# Patient Record
Sex: Female | Born: 1985 | Race: Black or African American | Hispanic: No | Marital: Single | Smoking: Never smoker
Health system: Southern US, Community
[De-identification: ages and names within clinical notes are randomized; demographics above are authoritative.]

## PROBLEM LIST (undated history)

## (undated) HISTORY — PX: TMJ ARTHROPLASTY: SHX1066

---

## 2010-07-24 ENCOUNTER — Emergency Department (HOSPITAL_COMMUNITY): Admission: EM | Admit: 2010-07-24 | Discharge: 2010-07-24 | Payer: Self-pay | Admitting: Emergency Medicine

## 2010-12-15 LAB — POCT PREGNANCY, URINE: Preg Test, Ur: NEGATIVE

## 2010-12-15 LAB — URINALYSIS, ROUTINE W REFLEX MICROSCOPIC
Bilirubin Urine: NEGATIVE
Glucose, UA: NEGATIVE mg/dL
Ketones, ur: NEGATIVE mg/dL
Protein, ur: 100 mg/dL — AB
pH: 6 (ref 5.0–8.0)

## 2010-12-15 LAB — URINE MICROSCOPIC-ADD ON

## 2011-11-14 ENCOUNTER — Other Ambulatory Visit: Payer: Self-pay | Admitting: Obstetrics and Gynecology

## 2011-11-14 DIAGNOSIS — N63 Unspecified lump in unspecified breast: Secondary | ICD-10-CM

## 2011-11-16 ENCOUNTER — Other Ambulatory Visit: Payer: Self-pay | Admitting: Obstetrics and Gynecology

## 2011-11-16 ENCOUNTER — Ambulatory Visit
Admission: RE | Admit: 2011-11-16 | Discharge: 2011-11-16 | Disposition: A | Discharge status: Home / Self Care | Payer: BC Managed Care – PPO | Source: Ambulatory Visit | Attending: Obstetrics and Gynecology | Admitting: Obstetrics and Gynecology

## 2011-11-16 ENCOUNTER — Other Ambulatory Visit: Payer: Self-pay

## 2011-11-16 DIAGNOSIS — N63 Unspecified lump in unspecified breast: Secondary | ICD-10-CM

## 2012-04-23 ENCOUNTER — Other Ambulatory Visit: Payer: Self-pay | Admitting: Obstetrics and Gynecology

## 2012-04-23 DIAGNOSIS — D249 Benign neoplasm of unspecified breast: Secondary | ICD-10-CM

## 2012-05-09 ENCOUNTER — Ambulatory Visit
Admission: RE | Admit: 2012-05-09 | Discharge: 2012-05-09 | Disposition: A | Discharge status: Home / Self Care | Payer: BC Managed Care – PPO | Source: Ambulatory Visit | Attending: Obstetrics and Gynecology | Admitting: Obstetrics and Gynecology

## 2012-05-09 DIAGNOSIS — D249 Benign neoplasm of unspecified breast: Secondary | ICD-10-CM

## 2012-10-22 ENCOUNTER — Other Ambulatory Visit: Payer: Self-pay | Admitting: Obstetrics and Gynecology

## 2012-10-22 DIAGNOSIS — N63 Unspecified lump in unspecified breast: Secondary | ICD-10-CM

## 2012-11-12 ENCOUNTER — Ambulatory Visit
Admission: RE | Admit: 2012-11-12 | Discharge: 2012-11-12 | Disposition: A | Discharge status: Home / Self Care | Payer: BC Managed Care – PPO | Source: Ambulatory Visit | Attending: Obstetrics and Gynecology | Admitting: Obstetrics and Gynecology

## 2012-11-12 DIAGNOSIS — N63 Unspecified lump in unspecified breast: Secondary | ICD-10-CM

## 2013-05-13 ENCOUNTER — Other Ambulatory Visit: Payer: Self-pay | Admitting: Obstetrics and Gynecology

## 2013-05-13 DIAGNOSIS — N6320 Unspecified lump in the left breast, unspecified quadrant: Secondary | ICD-10-CM

## 2013-05-20 ENCOUNTER — Ambulatory Visit
Admission: RE | Admit: 2013-05-20 | Discharge: 2013-05-20 | Disposition: A | Discharge status: Home / Self Care | Payer: Self-pay | Source: Ambulatory Visit | Attending: Obstetrics and Gynecology | Admitting: Obstetrics and Gynecology

## 2013-05-20 DIAGNOSIS — N6320 Unspecified lump in the left breast, unspecified quadrant: Secondary | ICD-10-CM

## 2013-11-25 ENCOUNTER — Other Ambulatory Visit: Payer: Self-pay | Admitting: Obstetrics and Gynecology

## 2013-11-25 DIAGNOSIS — D249 Benign neoplasm of unspecified breast: Secondary | ICD-10-CM

## 2013-11-29 ENCOUNTER — Ambulatory Visit
Admission: RE | Admit: 2013-11-29 | Discharge: 2013-11-29 | Disposition: A | Discharge status: Home / Self Care | Payer: BC Managed Care – PPO | Source: Ambulatory Visit | Attending: Obstetrics and Gynecology | Admitting: Obstetrics and Gynecology

## 2013-11-29 DIAGNOSIS — D249 Benign neoplasm of unspecified breast: Secondary | ICD-10-CM

## 2014-04-17 LAB — OB RESULTS CONSOLE ANTIBODY SCREEN: Antibody Screen: NEGATIVE

## 2014-04-17 LAB — OB RESULTS CONSOLE HIV ANTIBODY (ROUTINE TESTING): HIV: NONREACTIVE

## 2014-04-17 LAB — OB RESULTS CONSOLE RPR: RPR: NONREACTIVE

## 2014-04-17 LAB — OB RESULTS CONSOLE ABO/RH: RH Type: POSITIVE

## 2014-04-17 LAB — OB RESULTS CONSOLE GC/CHLAMYDIA
Chlamydia: NEGATIVE
Gonorrhea: NEGATIVE

## 2014-04-17 LAB — OB RESULTS CONSOLE HEPATITIS B SURFACE ANTIGEN: HEP B S AG: NEGATIVE

## 2014-04-17 LAB — OB RESULTS CONSOLE RUBELLA ANTIBODY, IGM: RUBELLA: IMMUNE

## 2014-09-30 LAB — OB RESULTS CONSOLE GBS: GBS: POSITIVE

## 2014-10-03 NOTE — L&D Delivery Note (Signed)
Delivery Note At 12:11 PM a viable and healthy female was delivered via Vaginal, Spontaneous Delivery (Presentation:OA ; ROT  ).  APGAR: 8,9 ; weight P .   Placenta status: Intact, Spontaneous.  Cord: 3 vessels with the following complications: None.    Anesthesia: Epidural  Episiotomy: None Lacerations: 1st degree Suture Repair: 3.0 vicryl rapide Est. Blood Loss (mL): 400  Mom to postpartum.  Baby to Couplet care / Skin to Skin.  Green, Annette Merriott 10/23/2014, 12:32 PM  Br/Bo, RI, Tdap in [facility], O+, Contra?  D/W pt r/b/a of circumcision for female infant, wish to proceed

## 2014-10-22 ENCOUNTER — Inpatient Hospital Stay (HOSPITAL_COMMUNITY)
Admission: AD | Admit: 2014-10-22 | Discharge: 2014-10-25 | DRG: 775 | Disposition: A | Discharge status: Home / Self Care | Payer: BLUE CROSS/BLUE SHIELD | Source: Ambulatory Visit | Attending: Obstetrics and Gynecology | Admitting: Obstetrics and Gynecology

## 2014-10-22 ENCOUNTER — Encounter (HOSPITAL_COMMUNITY): Payer: Self-pay | Admitting: *Deleted

## 2014-10-22 ENCOUNTER — Inpatient Hospital Stay (HOSPITAL_COMMUNITY): Payer: BLUE CROSS/BLUE SHIELD

## 2014-10-22 DIAGNOSIS — O471 False labor at or after 37 completed weeks of gestation: Secondary | ICD-10-CM

## 2014-10-22 DIAGNOSIS — I959 Hypotension, unspecified: Secondary | ICD-10-CM | POA: Diagnosis present

## 2014-10-22 DIAGNOSIS — O4690 Antepartum hemorrhage, unspecified, unspecified trimester: Secondary | ICD-10-CM | POA: Diagnosis present

## 2014-10-22 DIAGNOSIS — O289 Unspecified abnormal findings on antenatal screening of mother: Secondary | ICD-10-CM | POA: Diagnosis not present

## 2014-10-22 DIAGNOSIS — O288 Other abnormal findings on antenatal screening of mother: Secondary | ICD-10-CM | POA: Insufficient documentation

## 2014-10-22 DIAGNOSIS — Z3A38 38 weeks gestation of pregnancy: Secondary | ICD-10-CM | POA: Diagnosis not present

## 2014-10-22 LAB — CBC
HCT: 33.8 % — ABNORMAL LOW (ref 36.0–46.0)
HEMOGLOBIN: 11.6 g/dL — AB (ref 12.0–15.0)
MCH: 27.6 pg (ref 26.0–34.0)
MCHC: 34.3 g/dL (ref 30.0–36.0)
MCV: 80.3 fL (ref 78.0–100.0)
PLATELETS: 257 10*3/uL (ref 150–400)
RBC: 4.21 MIL/uL (ref 3.87–5.11)
RDW: 14.1 % (ref 11.5–15.5)
WBC: 18 10*3/uL — AB (ref 4.0–10.5)

## 2014-10-22 MED ORDER — LACTATED RINGERS IV SOLN
INTRAVENOUS | Status: DC
  Administered 2014-10-22 – 2014-10-23 (×3): via INTRAVENOUS

## 2014-10-22 NOTE — MAU Note (Signed)
Pt states she noted blood in her underwear today @ approx 1630, also mucus.  Has had some abd cramping since then.

## 2014-10-22 NOTE — MAU Provider Note (Signed)
History     CSN: [id number]  Arrival date and time: 10/22/14 3474   First Provider Initiated Contact with Patient 10/22/14 1936      Chief Complaint  Patient presents with  . Abdominal Cramping  . Vaginal Bleeding   HPI    Ms.Annette Green is a 29 y.o. female G1P0 at [redacted]w[redacted]d who presents with concerns regarding a gush of fluid around 1700; the fluid was pink in color. After she used the bathroom she noticed blood clots in the toilet. She complains of mild abdominal cramping.   + fetal movement Denies bleeding since she has been in MAU.   OB History    Gravida Para Term Preterm AB TAB SAB Ectopic Multiple Living   1               History reviewed. No pertinent past medical history.  History reviewed. No pertinent past surgical history.  History reviewed. No pertinent family history.  History  Substance Use Topics  . Smoking status: Never Smoker   . Smokeless tobacco: Not on file  . Alcohol Use: Not on file    Allergies: No Known Allergies  Prescriptions prior to admission  Medication Sig Dispense Refill Last Dose  . Dimethicone-Zinc Oxide-Vit A-D (A & D ZINC OXIDE EX) Apply 1 application topically at bedtime.   10/21/2014 at Unknown time  . ferrous sulfate 325 (65 FE) MG tablet Take 325 mg by mouth 2 (two) times daily with a meal.   10/22/2014 at Unknown time  . Prenatal Vit-Fe Fumarate-FA (PRENATAL MULTIVITAMIN) TABS tablet Take 1 tablet by mouth at bedtime.   10/21/2014 at Unknown time   No results found for this or any previous visit (from the past 48 hour(s)).   Review of Systems  Constitutional: Negative for fever and chills.  Gastrointestinal: Positive for abdominal pain (Mild menstrual cramping ). Negative for nausea, vomiting, diarrhea and constipation.  Genitourinary: Negative for dysuria, urgency and frequency.   Physical Exam   Blood pressure 119/80, pulse 106, temperature 99 F (37.2 C), resp. rate 18, height 5\' 5"  (1.651 m), weight 86.637 kg (191  lb).  Physical Exam  Constitutional: She is oriented to person, place, and time. She appears well-developed and well-nourished. No distress.  HENT:  Head: Normocephalic.  GI: Soft.  Genitourinary:   Speculum exam: Vagina - Small-moderate amount of mucus like, pink discharge with few, pea size clots. No pooling of fluid in the vaginal canal.  Cervix - No contact bleeding Chaperone present for exam. Dilation: 1.5 Effacement (%): 70. Bag of water palpated through cervix.  Exam by:: [name] NP  Musculoskeletal: Normal range of motion.  Neurological: She is alert and oriented to person, place, and time.  Skin: Skin is warm. She is not diaphoretic.  Psychiatric: Her behavior is normal.    Fetal Tracing: Baseline: 140 bpm  Variability:Moderate Accelerations: 15x15 Decelerations: variable decel down to 115 lasting 35 secs with recovery back to baseline  Toco: UI, irregular contractions  Decelerations: multiple variable decelerations and few late decelerations noted Contractions: irregular  NST reviewed remotely by Dr. Ulanda Edison  MAU Course  Procedures  None  MDM [name] slide negative  Spoke to Dr. Ulanda Edison at 2000 BPP Will monitor patient in MAU  Report given to [name] PA who resumes care of the patient.   [name], NP 10/22/2014 8:05 PM  2005 - Care assumed from [name], NP. Patient in [place] for BPP.  BPP 6/8 - no sustained breathing noted 2140 -  Called to discuss with Dr. Ulanda Edison. He will review the NST from home and let me know what he decides for patient management 2200 - received call from Dr. Ulanda Edison. He has reviewed the NST. He would like to continue to monitor x 1 hour and re-assess at that time whether patient can be discharged or requires admission.  2315 - Called Dr. Ulanda Edison. He will review NST again and call back to MAU with plan for patient Dr Ulanda Edison returns call to MAU. He would like to admit patient to L&D for induction of labor due to variable  and late decelerations noted on NST.  Discussed this with the patient and her mother. They agree with the plan.  Orders placed for induction per Dr. Ulanda Edison and routine labor admit orders Assessment and Plan  A: SIUP at [redacted]w[redacted]d Uterine contractions Vaginal bleeding in pregnancy Decelerations on NST  P: Admit to L&D for induction of labor  Luvenia Redden, PA-C  10/22/2014 9:08 PM

## 2014-10-22 NOTE — MAU Note (Signed)
Pt unsure about why an induction is necessary, Tomi Bamberger PA in to discuss with patient about reasons and orders that Dr. Ulanda Edison has left.

## 2014-10-22 NOTE — MAU Note (Signed)
Dr. Ulanda Edison spoke with Tomi Bamberger PA and would like patient to be monitored for another 1 hour before making a decision to send patient home or admit patient. Patient to be given this information.

## 2014-10-23 ENCOUNTER — Encounter (HOSPITAL_COMMUNITY): Payer: Self-pay | Admitting: *Deleted

## 2014-10-23 ENCOUNTER — Inpatient Hospital Stay (HOSPITAL_COMMUNITY): Payer: BLUE CROSS/BLUE SHIELD | Admitting: Anesthesiology

## 2014-10-23 DIAGNOSIS — O4690 Antepartum hemorrhage, unspecified, unspecified trimester: Secondary | ICD-10-CM | POA: Diagnosis present

## 2014-10-23 DIAGNOSIS — Z3A38 38 weeks gestation of pregnancy: Secondary | ICD-10-CM | POA: Diagnosis present

## 2014-10-23 DIAGNOSIS — O289 Unspecified abnormal findings on antenatal screening of mother: Secondary | ICD-10-CM | POA: Insufficient documentation

## 2014-10-23 DIAGNOSIS — O288 Other abnormal findings on antenatal screening of mother: Secondary | ICD-10-CM | POA: Insufficient documentation

## 2014-10-23 DIAGNOSIS — I959 Hypotension, unspecified: Secondary | ICD-10-CM | POA: Diagnosis present

## 2014-10-23 LAB — ABO/RH: ABO/RH(D): O POS

## 2014-10-23 LAB — TYPE AND SCREEN
ABO/RH(D): O POS
ANTIBODY SCREEN: NEGATIVE

## 2014-10-23 MED ORDER — DIPHENHYDRAMINE HCL 25 MG PO CAPS: 25.0000 mg | ORAL_CAPSULE | Freq: Four times a day (QID) | ORAL | Status: DC | PRN

## 2014-10-23 MED ORDER — OXYTOCIN BOLUS FROM INFUSION: 500.0000 mL | INTRAVENOUS | Status: DC

## 2014-10-23 MED ORDER — ONDANSETRON HCL 4 MG PO TABS: 4.0000 mg | ORAL_TABLET | ORAL | Status: DC | PRN

## 2014-10-23 MED ORDER — OXYCODONE-ACETAMINOPHEN 5-325 MG PO TABS: 2.0000 | ORAL_TABLET | ORAL | Status: DC | PRN

## 2014-10-23 MED ORDER — LIDOCAINE HCL (PF) 1 % IJ SOLN
30.0000 mL | INTRAMUSCULAR | Status: DC | PRN
  Filled 2014-10-23: qty 30

## 2014-10-23 MED ORDER — OXYCODONE-ACETAMINOPHEN 5-325 MG PO TABS: 1.0000 | ORAL_TABLET | ORAL | Status: DC | PRN

## 2014-10-23 MED ORDER — PHENYLEPHRINE 40 MCG/ML (10ML) SYRINGE FOR IV PUSH (FOR BLOOD PRESSURE SUPPORT)
80.0000 ug | PREFILLED_SYRINGE | INTRAVENOUS | Status: DC | PRN
  Administered 2014-10-23: 80 ug via INTRAVENOUS
  Filled 2014-10-23: qty 2

## 2014-10-23 MED ORDER — LANOLIN HYDROUS EX OINT: TOPICAL_OINTMENT | CUTANEOUS | Status: DC | PRN

## 2014-10-23 MED ORDER — EPHEDRINE 5 MG/ML INJ
10.0000 mg | INTRAVENOUS | Status: DC | PRN
  Filled 2014-10-23: qty 2
  Filled 2014-10-23: qty 4

## 2014-10-23 MED ORDER — PENICILLIN G POTASSIUM 5000000 UNITS IJ SOLR
2.5000 10*6.[IU] | INTRAVENOUS | Status: DC
  Administered 2014-10-23 (×2): 2.5 10*6.[IU] via INTRAVENOUS
  Filled 2014-10-23 (×7): qty 2.5

## 2014-10-23 MED ORDER — WITCH HAZEL-GLYCERIN EX PADS: 1.0000 "application " | MEDICATED_PAD | CUTANEOUS | Status: DC | PRN

## 2014-10-23 MED ORDER — ONDANSETRON HCL 4 MG/2ML IJ SOLN: 4.0000 mg | INTRAMUSCULAR | Status: DC | PRN

## 2014-10-23 MED ORDER — PRENATAL MULTIVITAMIN CH
1.0000 | ORAL_TABLET | Freq: Every day | ORAL | Status: DC
  Administered 2014-10-24 – 2014-10-25 (×2): 1 via ORAL
  Filled 2014-10-23 (×2): qty 1

## 2014-10-23 MED ORDER — BENZOCAINE-MENTHOL 20-0.5 % EX AERO
1.0000 "application " | INHALATION_SPRAY | CUTANEOUS | Status: DC | PRN
  Administered 2014-10-23: 1 via TOPICAL
  Filled 2014-10-23: qty 56

## 2014-10-23 MED ORDER — IBUPROFEN 600 MG PO TABS
600.0000 mg | ORAL_TABLET | Freq: Four times a day (QID) | ORAL | Status: DC
  Administered 2014-10-24 – 2014-10-25 (×7): 600 mg via ORAL
  Filled 2014-10-23 (×8): qty 1

## 2014-10-23 MED ORDER — DIPHENHYDRAMINE HCL 50 MG/ML IJ SOLN: 12.5000 mg | INTRAMUSCULAR | Status: DC | PRN

## 2014-10-23 MED ORDER — OXYTOCIN 40 UNITS IN LACTATED RINGERS INFUSION - SIMPLE MED
62.5000 mL/h | INTRAVENOUS | Status: DC
  Administered 2014-10-23: 62.5 mL/h via INTRAVENOUS
  Filled 2014-10-23: qty 1000

## 2014-10-23 MED ORDER — LIDOCAINE-EPINEPHRINE (PF) 2 %-1:200000 IJ SOLN
INTRAMUSCULAR | Status: DC | PRN
  Administered 2014-10-23: 3 mL

## 2014-10-23 MED ORDER — FLEET ENEMA 7-19 GM/118ML RE ENEM: 1.0000 | ENEMA | RECTAL | Status: DC | PRN

## 2014-10-23 MED ORDER — PHENYLEPHRINE 40 MCG/ML (10ML) SYRINGE FOR IV PUSH (FOR BLOOD PRESSURE SUPPORT)
80.0000 ug | PREFILLED_SYRINGE | INTRAVENOUS | Status: AC | PRN
  Administered 2014-10-23 (×3): 80 ug via INTRAVENOUS
  Filled 2014-10-23: qty 20

## 2014-10-23 MED ORDER — LACTATED RINGERS IV SOLN
500.0000 mL | Freq: Once | INTRAVENOUS | Status: AC
  Administered 2014-10-23: 500 mL via INTRAVENOUS

## 2014-10-23 MED ORDER — LACTATED RINGERS IV SOLN
500.0000 mL | INTRAVENOUS | Status: DC | PRN
Start: 2014-10-23 — End: 2014-10-23

## 2014-10-23 MED ORDER — SENNOSIDES-DOCUSATE SODIUM 8.6-50 MG PO TABS
2.0000 | ORAL_TABLET | ORAL | Status: DC
  Administered 2014-10-24 – 2014-10-25 (×2): 2 via ORAL
  Filled 2014-10-23 (×2): qty 2

## 2014-10-23 MED ORDER — ZOLPIDEM TARTRATE 5 MG PO TABS
5.0000 mg | ORAL_TABLET | Freq: Every evening | ORAL | Status: DC | PRN
Start: 2014-10-23 — End: 2014-10-25

## 2014-10-23 MED ORDER — LACTATED RINGERS IV SOLN: INTRAVENOUS | Status: DC

## 2014-10-23 MED ORDER — EPHEDRINE 5 MG/ML INJ
10.0000 mg | INTRAVENOUS | Status: DC | PRN
  Administered 2014-10-23: 10 mg via INTRAVENOUS
  Filled 2014-10-23: qty 2

## 2014-10-23 MED ORDER — ACETAMINOPHEN 325 MG PO TABS: 650.0000 mg | ORAL_TABLET | ORAL | Status: DC | PRN

## 2014-10-23 MED ORDER — OXYTOCIN 40 UNITS IN LACTATED RINGERS INFUSION - SIMPLE MED
1.0000 m[IU]/min | INTRAVENOUS | Status: DC
  Administered 2014-10-23 (×2): 1 m[IU]/min via INTRAVENOUS
  Administered 2014-10-23: 2 m[IU]/min via INTRAVENOUS

## 2014-10-23 MED ORDER — DIBUCAINE 1 % RE OINT: 1.0000 "application " | TOPICAL_OINTMENT | RECTAL | Status: DC | PRN

## 2014-10-23 MED ORDER — FENTANYL 2.5 MCG/ML BUPIVACAINE 1/10 % EPIDURAL INFUSION (WH - ANES)
14.0000 mL/h | INTRAMUSCULAR | Status: DC | PRN
  Administered 2014-10-23: 14 mL/h via EPIDURAL
  Filled 2014-10-23: qty 125

## 2014-10-23 MED ORDER — CITRIC ACID-SODIUM CITRATE 334-500 MG/5ML PO SOLN: 30.0000 mL | ORAL | Status: DC | PRN

## 2014-10-23 MED ORDER — OXYCODONE-ACETAMINOPHEN 5-325 MG PO TABS
1.0000 | ORAL_TABLET | ORAL | Status: DC | PRN
  Administered 2014-10-24 – 2014-10-25 (×5): 1 via ORAL
  Filled 2014-10-23 (×5): qty 1

## 2014-10-23 MED ORDER — PENICILLIN G POTASSIUM 5000000 UNITS IJ SOLR
5.0000 10*6.[IU] | Freq: Once | INTRAVENOUS | Status: AC
  Administered 2014-10-23: 5 10*6.[IU] via INTRAVENOUS
  Filled 2014-10-23: qty 5

## 2014-10-23 MED ORDER — SIMETHICONE 80 MG PO CHEW: 80.0000 mg | CHEWABLE_TABLET | ORAL | Status: DC | PRN

## 2014-10-23 MED ORDER — BUPIVACAINE HCL (PF) 0.25 % IJ SOLN
INTRAMUSCULAR | Status: DC | PRN
  Administered 2014-10-23 (×2): 4 mL via EPIDURAL

## 2014-10-23 MED ORDER — ONDANSETRON HCL 4 MG/2ML IJ SOLN
4.0000 mg | Freq: Four times a day (QID) | INTRAMUSCULAR | Status: DC | PRN
  Administered 2014-10-23: 4 mg via INTRAVENOUS
  Filled 2014-10-23: qty 2

## 2014-10-23 MED ORDER — TERBUTALINE SULFATE 1 MG/ML IJ SOLN
0.2500 mg | Freq: Once | INTRAMUSCULAR | Status: DC | PRN
Start: 2014-10-23 — End: 2014-10-23
  Filled 2014-10-23: qty 1

## 2014-10-23 MED ORDER — FENTANYL 2.5 MCG/ML BUPIVACAINE 1/10 % EPIDURAL INFUSION (WH - ANES)
INTRAMUSCULAR | Status: DC | PRN
  Administered 2014-10-23: 14 mL/h via EPIDURAL

## 2014-10-23 NOTE — Progress Notes (Signed)
Patient ID: Annette Green, female   DOB: [birth year], 29 y.o.   MRN: [id number]  Comfortable with epidural.    AF VSS gen NAD FHTs 150's, some variables, + scalp stim category 1-2 toco q 2-4  SVE 7.8/90/0-+1  IUPC placed w/o diff/comp  IOL for non-reassuring strip Close monitoring Increase pitocin prn, now 68mU

## 2014-10-23 NOTE — Progress Notes (Signed)
Patient ID: Annette Green, female   DOB: 10/25/1985, 29 y.o.   MRN: [id number] Pitocin is at 1 mu/minute and the contractions are q 2-4 minutes. There are some decelerations but nothing that requires delivery. The cervix is 7 cm slightly swollen and the vertex is at - 1/-2 station. I am transferring her care to Dr. Melba Coon

## 2014-10-23 NOTE — Anesthesia Preprocedure Evaluation (Signed)
Anesthesia Evaluation  Patient identified by MRN, date of birth, ID band  Reviewed: Allergy & Precautions, NPO status , Patient's Chart, lab work & pertinent test results  History of Anesthesia Complications Negative for: history of anesthetic complications  Airway Mallampati: II  TM Distance: >3 FB Neck ROM: Full    Dental  (+) Teeth Intact   Pulmonary neg pulmonary ROS,  breath sounds clear to auscultation        Cardiovascular negative cardio ROS  Rhythm:Regular     Neuro/Psych negative neurological ROS  negative psych ROS   GI/Hepatic negative GI ROS, Neg liver ROS,   Endo/Other    Renal/GU negative Renal ROS     Musculoskeletal   Abdominal   Peds  Hematology  (+) anemia ,   Anesthesia Other Findings   Reproductive/Obstetrics (+) Pregnancy                             Anesthesia Physical Anesthesia Plan  ASA: II  Anesthesia Plan:    Post-op Pain Management:    Induction:   Airway Management Planned:   Additional Equipment:   Intra-op Plan:   Post-operative Plan:   Informed Consent: I have reviewed the patients History and Physical, chart, labs and discussed the procedure including the risks, benefits and alternatives for the proposed anesthesia with the patient or authorized representative who has indicated his/her understanding and acceptance.     Plan Discussed with: Anesthesiologist  Anesthesia Plan Comments:         Anesthesia Quick Evaluation

## 2014-10-23 NOTE — Progress Notes (Signed)
Patient ID: Annette Green, female   DOB: 12/11/1985, 29 y.o.   MRN: [id number] Pt received an epidural and quickly began having decelerations of the FHR and hypotension Position was changed from left to right to left and O2 was given Dr. Ermalene Postin was called after 3 doses of phenylephrine were ineffective at raising her BP and decelerations and bradycardia persisted. He gave her ephedrine.The BP remained low but stopping pitocin has coincided with improvement of the FHR.Will see if progress occurs without pitocin.

## 2014-10-23 NOTE — Progress Notes (Signed)
Dr Ulanda Edison at bedside during epidural placement. Monitors reapplied at 0427. BP lower than baseline after medication started by Dr Ermalene Postin. FHR baseline 135 with late, prolonged decelerations down to max of 80's. Position changed to left tilt, phenylephrine given at 0438, 0442, 0446. Dr Ermalene Postin called to bedside and an additional dose was given by MD at 0447. Ephedrine given at 0449 By Dr Ermalene Postin. BP now 102/49. Pitocin d/c'ed at 0442. O2 10/L FM @ 0442. At 0452 baseline now 150's with late decelerations but no further prolonged decels. Dr Ulanda Edison remained at bedside and reviewed strip. Order to restart pitocin at 1 mu/min at [facility] and to continue to increase by 1 mu/min Q 30 minutes as long as FHR remains stable. MD Aware of late decelerations.

## 2014-10-23 NOTE — H&P (Signed)
NAMEMarland Kitchen  JUDAEA, BURGOON NO.:  [id number]  MEDICAL RECORD NO.:  [id number]  LOCATION:                                 FACILITY:  PHYSICIAN:  Lucille Passy. Ulanda Edison, M.D.      DATE OF BIRTH:  DATE OF ADMISSION:  10/23/2014 DATE OF DISCHARGE:                             HISTORY & PHYSICAL   PRESENT ILLNESS:  This is a 29 year old black female, para 0-0-1-0, gravida 2, EDC November 04, 2014, admitted for induction of labor because of a nonreassuring fetal heart rate pattern with repetitive, but not consistent, late decelerations of the fetal heart rate.  Ultrasound on April 01, 2014, showed an intrauterine pregnancy at 8 weeks and 6 days, [facility] November 05, 2014, final due date was given as November 04, 2014, based on her last menstrual period of January 28, 2014.  The ultrasound at that time showed an anterior subserosal fibroid.  Blood group and type O positive, negative antibody, RPR negative, urine culture negative, hepatitis B surface antigen negative, HIV negative, GC and Chlamydia negative.  Rubella immune.  Hemoglobin electrophoresis AA.  Cystic fibrosis negative.  First trimester screen and AFP normal.  One-hour Glucola 86.  Group B strep positive.  Repeat HIV and RPR negative.  The patient began her prenatal course after her early ultrasound.  At her 81- week labs, she was anemic and advised to take ferrous sulfate.  She had no significant problems during the pregnancy.  Her initial weight was 162, her final weight was 191.  Blood pressure remained normal.  At her last prenatal visit, her cervix was 1 cm, 50% effaced, and that was on October 20, 2014.  She came to the Maternity Admission Unit on October 22, 2014, after noting some bloody discharge from the vagina.  She had, over the course of 3 hours or so, recurrent late decelerations of the fetal heart rate even though they were not very close together.  A biophysical profile of 6/8.  When the decelerations continued, I  elected to induce her labor rather than send her home.  She was admitted, placed on Pitocin, and thus far has tolerated labor.  PAST MEDICAL HISTORY:  Reveals that she has a history of eczema.  PAST SURGICAL HISTORY:  None.  ALLERGIES:  No known drug allergies.  No latex allergy or food allergy.  GYN HISTORY:  She has had evaluation for abnormal Pap smears since 2009, with low-grade SIL.  She had a history of chlamydia x2 in 2009.  FAMILY HISTORY:  Mother with hypertension.  Maternal grandmother with chronic renal failure and died at age 69.  SOCIAL HISTORY:  The patient has never smoked.  Occasionally drank alcohol prior to pregnancy.  Denies illicit drugs.  Last marijuana was Feb 14, 2014.  She apparently has a 4 years of college at [employer] in Therapist, occupational.  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  PHYSICAL EXAMINATION:  VITAL SIGNS:  On admission, temperature 98.6, pulse 100, respirations 20, blood pressure 109/72. HEART:  Normal size and sounds.  No murmurs. LUNGS:  Clear to auscultation. ABDOMEN:  At her last prenatal visit, her fundal height was 38 cm.  At  present time, the fetal heart tones are normal.  Cervix is 4 cm, 100% vertex, at a -2.  Artificial rupture of the membranes produced clear fluid.  The patient had requested and is prepared to receive an epidural.  ADMITTING IMPRESSION:  Intrauterine pregnancy at 38 weeks and 2 days with recurrent late decelerations of the fetal heart rate in maternity admission, a biophysical profile of 6/8, the patient was admitted for induction of labor.     Lucille Passy. Ulanda Edison, M.D.     TFH/MEDQ  D:  10/23/2014  T:  10/23/2014  Job:  [id number]

## 2014-10-23 NOTE — Lactation Note (Signed)
This note was copied from the chart of [place]. Lactation Consultation Note  Patient Name: Annette Green [id number] Date: 10/23/2014 Reason for consult: Initial assessment of this mom and baby at 86 hours pp.  LC had attempted to visit earlier but mom had lots of visitors and was ordering dinner.  At this time, baby is asleep and STS with only MGM at bedside.  LC reinforced hand expression and demonstrated technique. Mom is a primipara and has stated her plan to "breast and formula feed".  RN, Debbie and LC have both demonstrated hand expression.  LC reviewed LEAD cautions and encouraged cue feedings and frequent STS.  Mom encouraged to feed baby 8-12 times/24 hours and with feeding cues. LC encouraged review of Baby and Me pp 9, 14 and 20-25 for STS and BF information. LC provided [employer] Resource brochure and reviewed [facility] services and list of community and web site resources.     Maternal Data Formula Feeding for Exclusion: Yes Reason for exclusion: Mother's choice to formula and breast feed on admission (LEAD cautions reviewed with mom and MGM) Has patient been taught Hand Expression?: Yes (RN and LC have both demonstrated hand expression) Does the patient have breastfeeding experience prior to this delivery?: No  Feeding    LATCH Score/Interventions           no LATCH score yet; baby fed for 5 minutes and then mom chose to give formula (6 ml's) at next feeding           Lactation Tools Discussed/Used   STS, cue feedings, hand expression LEAD cautions and supply and demand for milk production  Consult Status Consult Status: Follow-up Date: 10/24/14 Follow-up type: In-patient    Junious Dresser [facility] 10/23/2014, 10:36 PM

## 2014-10-23 NOTE — Anesthesia Procedure Notes (Signed)
Epidural Patient location during procedure: OB  Staffing Anesthesiologist: Parthena Fergeson, CHRIS Performed by: anesthesiologist   Preanesthetic Checklist Completed: patient identified, surgical consent, pre-op evaluation, timeout performed, IV checked, risks and benefits discussed and monitors and equipment checked  Epidural Patient position: sitting Prep: site prepped and draped and DuraPrep Patient monitoring: heart rate, cardiac monitor, continuous pulse ox and blood pressure Approach: midline Location: L4-L5 Injection technique: LOR saline  Needle:  Needle type: Tuohy  Needle gauge: 17 G Needle length: 9 cm Needle insertion depth: 8 cm Catheter type: closed end flexible Catheter size: 19 Gauge Catheter at skin depth: 15 cm Test dose: negative and 2% lidocaine with Epi 1:200 K  Assessment Events: blood not aspirated, injection not painful, no injection resistance, negative IV test and no paresthesia  Additional Notes H+P and labs checked, risks and benefits discussed with the patient, consent obtained, procedure tolerated well and without complications.  Reason for block:procedure for pain

## 2014-10-24 LAB — CBC
HEMATOCRIT: 29.4 % — AB (ref 36.0–46.0)
HEMOGLOBIN: 10 g/dL — AB (ref 12.0–15.0)
MCH: 27.3 pg (ref 26.0–34.0)
MCHC: 34 g/dL (ref 30.0–36.0)
MCV: 80.3 fL (ref 78.0–100.0)
Platelets: 255 10*3/uL (ref 150–400)
RBC: 3.66 MIL/uL — AB (ref 3.87–5.11)
RDW: 14.2 % (ref 11.5–15.5)
WBC: 21.1 10*3/uL — AB (ref 4.0–10.5)

## 2014-10-24 LAB — HIV ANTIBODY (ROUTINE TESTING W REFLEX): HIV 1/HIV 2 AB: NONREACTIVE

## 2014-10-24 LAB — RPR: RPR: NONREACTIVE

## 2014-10-24 MED ORDER — PRENATAL MULTIVITAMIN CH: 1.0000 | ORAL_TABLET | Freq: Every day | ORAL | Status: DC

## 2014-10-24 MED ORDER — OXYCODONE-ACETAMINOPHEN 5-325 MG PO TABS: 1.0000 | ORAL_TABLET | Freq: Four times a day (QID) | ORAL | Status: DC | PRN

## 2014-10-24 MED ORDER — IBUPROFEN 800 MG PO TABS: 800.0000 mg | ORAL_TABLET | Freq: Three times a day (TID) | ORAL | Status: DC | PRN

## 2014-10-24 NOTE — Anesthesia Postprocedure Evaluation (Signed)
Anesthesia Post Note  Patient: Annette Green  Procedure(s) Performed: * No procedures listed *  Anesthesia type: Epidural  Patient location: Mother/Baby  Post pain: Pain level controlled  Post assessment: Post-op Vital signs reviewed  Last Vitals:  Filed Vitals:   10/24/14 0507  BP: 110/64  Pulse: 75  Temp: 36.6 C  Resp: 18    Post vital signs: Reviewed  Level of consciousness:alert  Complications: No apparent anesthesia complications

## 2014-10-24 NOTE — Discharge Summary (Signed)
Obstetric Discharge Summary Reason for Admission: induction of labor and NRFHT Prenatal Procedures: none Intrapartum Procedures: spontaneous vaginal delivery Postpartum Procedures: none Complications-Operative and Postpartum: 1st degree perineal laceration HEMOGLOBIN  Date Value Ref Range Status  10/24/2014 10.0* 12.0 - 15.0 g/dL Final   HCT  Date Value Ref Range Status  10/24/2014 29.4* 36.0 - 46.0 % Final    Physical Exam:  General: alert and no distress Lochia: appropriate Uterine Fundus: firm  Discharge Diagnoses: Term Pregnancy-delivered  Discharge Information: Date: 10/24/2014 Activity: pelvic rest Diet: routine Medications: PNV, Ibuprofen and Percocet Condition: stable Instructions: refer to practice specific booklet Discharge to: home Follow-up Information    Follow up with Bovard-Stuckert, Ressie Slevin, MD. Schedule an appointment as soon as possible for a visit in 6 weeks.   Specialty:  Obstetrics and Gynecology   Why:  for postpartum visit   Contact information:   [address] [address] [phone]       Newborn Data: Live born female  Birth Weight: 6 lb 13.9 oz (3116 g) APGAR: 8, 9  Home with mother.  Bovard-Stuckert, Diahann Guajardo 10/24/2014, 9:09 AM

## 2014-10-24 NOTE — Progress Notes (Addendum)
Post Partum Day 1 Subjective: no complaints, up ad lib, voiding, tolerating PO and nl lochia, pain controlled  Objective: Blood pressure 110/64, pulse 75, temperature 97.9 F (36.6 C), temperature source Oral, resp. rate 18, height 5\' 5"  (1.651 m), weight 86.637 kg (191 lb), SpO2 100 %, unknown if currently breastfeeding.  Physical Exam:  General: alert and no distress Lochia: appropriate Uterine Fundus: firm   Recent Labs  10/22/14 2350 10/24/14 0535  HGB 11.6* 10.0*  HCT 33.8* 29.4*    Assessment/Plan: Plan for discharge tomorrow, Breastfeeding and Lactation consult.  Routine care.     LOS: 2 days   Bovard-Stuckert, Ellicia Alix 10/24/2014, 8:30 AM   Pt written for early discharge if OK per peds.

## 2014-10-24 NOTE — Lactation Note (Signed)
This note was copied from the chart of [place]. Lactation Consultation Note: Follow up visit with mom. Baby in nursery for circ. Mom reports baby has not latched very well only a few sucks. Reviewed normal behavior after circ. Encouraged skin to skin and watching for feeding cues. To call for assist when baby wakes for feeding. No questions at present.   Patient Name: Annette Green [id number] Date: 10/24/2014 Reason for consult: Follow-up assessment   Maternal Data Formula Feeding for Exclusion: Yes Reason for exclusion: Mother's choice to formula and breast feed on admission Does the patient have breastfeeding experience prior to this delivery?: No  Feeding    LATCH Score/Interventions                      Lactation Tools Discussed/Used     Consult Status Consult Status: Follow-up Date: 10/24/14 Follow-up type: In-patient    Truddie Crumble 10/24/2014, 9:31 AM

## 2014-10-24 NOTE — Lactation Note (Signed)
This note was copied from the chart of [place]. Lactation Consultation Note  Patient Name: Annette Green [id number] Date: 10/24/2014 Reason for consult: Follow-up assessment Baby 25 hours of life. MOB called out for assistance with latching baby. Baby was circumcised earlier this morning and has been sleepy at breast. Assisted mom to latch baby in football position to left breast. Demonstrated to mom how to set pillows up to position baby for a deep latch. Mom able to hand express drops of colostrum and then latch baby deeply when baby opened mouth wide. Baby suckled rhythmically with a few swallows noted. Mom states that she is comfortable in the position and said that this is the "best the baby has done" so far. Enc mom to offer lots of STS and nurse with cues and at least 8-12 times/24 hours. Enc mom to call out for assistance with latching as needed.  Maternal Data Formula Feeding for Exclusion: Yes Reason for exclusion: Mother's choice to formula feed on admision  Feeding Feeding Type: Breast Fed Length of feed:  (LC assessed first 10 minutes of BF.)  LATCH Score/Interventions Latch: Grasps breast easily, tongue down, lips flanged, rhythmical sucking. Intervention(s): Assist with latch;Breast compression;Adjust position  Audible Swallowing: A few with stimulation Intervention(s): Hand expression;Skin to skin  Type of Nipple: Everted at rest and after stimulation  Comfort (Breast/Nipple): Soft / non-tender     Hold (Positioning): Assistance needed to correctly position infant at breast and maintain latch.  LATCH Score: 8  Lactation Tools Discussed/Used     Consult Status Consult Status: Follow-up Date: 10/25/14 Follow-up type: In-patient    Inocente Salles 10/24/2014, 1:49 PM

## 2014-10-24 NOTE — Lactation Note (Signed)
This note was copied from the chart of [name]. Lactation Consultation Note  Upon entering the room.  Baby was swaddled and sleeping and mother states he will not wake to breastfeed. Suggest she undress him down to his diaper and feed him STS.  Mother hand expressed a few drops of colostrum. She placed baby in football hold.  Assisted w/ positioning. With a few attempts baby latched.  Encouraged mother to massage to keep him active. Discussed making sure he is on deep and how to [name]. Sucks and swallows observed for 16 min,  Suggest mother burp and switch sides. Reviewed how to set up her personal DEBP.  Reviewed milk storage and cleaning. Discussed cluster feeding and encouraged her to call if she needs further assistance.  Patient Name: Annette Green [id number] Date: 10/24/2014 Reason for consult: Follow-up assessment   Maternal Data    Feeding Feeding Type: Breast Fed  LATCH Score/Interventions Latch: Repeated attempts needed to sustain latch, nipple held in mouth throughout feeding, stimulation needed to elicit sucking reflex. Intervention(s): Adjust position;Assist with latch;Breast massage;Breast compression  Audible Swallowing: Spontaneous and intermittent  Type of Nipple: Everted at rest and after stimulation  Comfort (Breast/Nipple): Soft / non-tender     Hold (Positioning): Assistance needed to correctly position infant at breast and maintain latch.  LATCH Score: 8  Lactation Tools Discussed/Used     Consult Status Consult Status: Follow-up Date: 10/25/14 Follow-up type: In-patient    Vivianne Master [facility] 10/24/2014, 5:41 PM

## 2014-10-24 NOTE — Progress Notes (Signed)
Clinical Social Work Department PSYCHOSOCIAL ASSESSMENT - MATERNAL/CHILD 10/24/2014  Patient:  DICY, SMIGEL  Account Number:  [id number]  Admit Date:  10/22/2014  Ardine Eng Name:   Boy not yet named at time of assessment   Clinical Social Worker:  Lucita Ferrara, CLINICAL SOCIAL WORKER   Date/Time:  10/24/2014 09:30 AM  Date Referred:  10/23/2014   Referral source  Central Nursery     Referred reason  Substance Abuse   Other referral source:    I:  FAMILY / [place] legal guardian:  PARENT  Guardian - Name Guardian - Age Guardian - Address  Mersedes Alber [address] [place], [place] [address]  Joaquin Courts  currently incarcerated   Other household support members/support persons Other support:   MOB identified her mother as he rprimary support person. She endorsed strong family support from both her family and the FOB's family.    II  PSYCHOSOCIAL DATA Information Source:  Patient Interview  Museum/gallery curator and [employer] Employment:   MOB works Scientist, research (medical), and endorsed supportive employer.   Financial resources:  Multimedia programmer If [place]:    School / Grade:  N/A Music therapist / Child Services Coordination / Early Interventions:   None reported  Cultural issues impacting care:   None reported    III  STRENGTHS Strengths  Adequate Resources  Home prepared for Child (including basic supplies)  Supportive family/friends   Strength comment:    IV  RISK FACTORS AND CURRENT PROBLEMS Current Problem:  YES   Risk Factor & Current Problem Patient Issue Family Issue Risk Factor / Current Problem Comment  Substance Abuse Y N MOB presents wit history of THC. MOB reported last use in May prior to positive UPT.  UDS and MDS are pending.  Family/Relationship Issues Y N FOB is currently incarcerated.  He has been incarcerated for past 3 months, and has approximately 9 months until release.    V  SOCIAL WORK  ASSESSMENT CSW met with the MOB due to [facility] use.   MOB provided consent for the MGM to be present for the visit, but the MGM was observed to be sleeping/resting.  The MOB presented as easily engaged and receptive to the visit. She was quick to engage, displayed a full range in affect, and presented in a pleasant mood.  CSW was unable to observe any interactions between the MOB and the baby as the baby was being circumcised. MOB expressed appreciation for the visit and acknowledged ongoing availability of CSW.   CSW assisted the MOB to process her thoughts and feelings as she transitions to the postpartum period.  CSW also explored with the MOB normative feelings associated with the role transition to motherhood.  MOB expressed normative thoughts and feelings as she transitions to motherhood.  She processed to discuss with CSW her feelings of excitement that are also mixed with feelings of being  overwhelmed.  She discussed increased stress since the baby arrived earlier than anticipated and she still has items at home that need to be assembled for the baby.  She shared awareness that she will have time to complete the "finishing touches", and she expressed appreciation for her strong support system.  The MOB identified the primary stressor during the pregnancy as the FOB's incarceration. She stated that he was incarcerated 3 months ago, with 9 months remaining.  She stated that he violated his probation, but she did not further clarify his charges.  MOB stated that it has  been emotionally difficult for her since they continue to be in a significant relationship.  She stated that she and the FOB are attempting to "make the best of it", and discussed how they are in constant communication and are able to exchange photographs.  CSW guided the MOB to identify effective coping skills to assist with these stressors, and empowered her to continue to utilize the same coping skills.  MOB presents with an awareness of need  to take "one day at time", and she expressed belief that she will now be "busy" with the baby which will distract her from missing the FOB.  The MOB presents with self awareness secondary to effective emotional regulation skills.  MOB denied mental health history. She presented with awareness of postpartum depression and stated that she has already started to research signs and symptoms.  The MOB presented as engaged as the CSW provided education, and the MOB discussed intention to notify her MD if she notes symptoms.   MOB acknowledged THC use.  She reported last use was in May, prior to learning of pregnancy.  She denied any substance use since learning of pregnancy.  The MOB verbalized understanding of hospital drug screen policy, and denied additional questions or concerns.   No barriers to discharge.    VI SOCIAL WORK PLAN Social Work Therapist, art  No Further Intervention Required / No Barriers to Discharge   Type of pt/family education:   Postpartum depression  Hospital drug screen policy   If child protective services report - county:  N/A If child protective services report - date:  N/A Information/referral to community resources comment:   No referrals needed at this time.   Other social work plan:   CSW to follow UDS and MDS and will make CPS report if positive.  CSW to follow up with MOB PRN.

## 2014-10-25 NOTE — Progress Notes (Signed)
PPD #2 Doing well Afeb, VSS D/c home 

## 2014-10-25 NOTE — Lactation Note (Addendum)
This note was copied from the chart of [facility]. Lactation Consultation Note  Patient Name: Annette Green [id number] Date: 10/25/2014 Reason for consult: Follow-up assessment  Baby is 46 hours, 7% weight loss, voiding and stooling adequately. Has had some bottles - 5-10 ml , and latching range = 22-60 min. Per mom breast feeding picked up last night and more swallows noted. Mom denies soreness, breast are filling. LC reviewed basics - breast massage, hand express, pre-pump if needed when milk comes  In . Sore nipple and engorgement prevention and tx. Referring to pages 24 -25 in the Baby and me booklet. LC observed latch on the right breast with depth , in cross cradle , multiply swallows , increased with breast compressions. Baby still in a consistent swallowing pattern at 8 mins , and mom comfortable.  Mother informed of post-discharge support and given phone number to the lactation department, including services for phone call assistance; out-patient appointments; and breastfeeding support group. List of other breastfeeding resources in the community given in the handout. Encouraged mother to call for problems or concerns related to breastfeeding.   Maternal Data Has patient been taught Hand Expression?: Yes  Feeding Feeding Type: Breast Fed  LATCH Score/Interventions Latch: Grasps breast easily, tongue down, lips flanged, rhythmical sucking. Intervention(s): Adjust position;Assist with latch;Breast massage;Breast compression  Audible Swallowing: A few with stimulation  Type of Nipple: Everted at rest and after stimulation  Comfort (Breast/Nipple): Soft / non-tender     Hold (Positioning): Assistance needed to correctly position infant at breast and maintain latch. Intervention(s): Breastfeeding basics reviewed;Support Pillows;Position options;Skin to skin  LATCH Score: 8  Lactation Tools Discussed/Used Tools: Pump Breast pump type: Manual WIC Program:  No Pump Review: Setup, frequency, and cleaning;Milk Storage Initiated by:: reviewed Fernande Boyden  Date initiated:: 10/25/14   Consult Status Consult Status: Complete Date: 10/25/14    Myer Haff 10/25/2014, 10:40 AM

## 2014-10-25 NOTE — Discharge Instructions (Signed)
As per discharge pamphlet °

## 2014-10-26 ENCOUNTER — Ambulatory Visit: Payer: Self-pay

## 2014-10-26 NOTE — Lactation Note (Signed)
This note was copied from the chart of [place]. Lactation Consultation Note  Patient Name: Annette Green [id number] Date: 10/26/2014 Reason for consult: Follow-up assessment Requested by patient for [facility] to see. Mother is experiencing engorgement and breast discomfort. She is able to express milk with her DEBP and expressed 45 ml with the last pumping. Breast are firm to touch, axillary ridge firm and clogged ducts noted throughout the breast. She has been applying ice packs and breast does soften some with pumping. She reports that baby can latch but is sleepy and falls asleep at the breast. He is also getting single phototherapy. Reviewed management and physiology of engorgement. Patient verbalized understanding. She has not pumped in 3-4 hours and breast are firm again. Instructed to breastfeed with cues and pump at least q 3 hours. Ice packs refilled for mother to apply to breast. Baby was fed 45 ml of EBM per bottle within this last hour and placed back under the lights.  Maternal Data    Feeding Feeding Type: Bottle Fed - Breast Milk Nipple Type: Slow - flow  LATCH Score/Interventions                      Lactation Tools Discussed/Used Breast pump type: Double-Electric Breast Pump   Consult Status Consult Status: Follow-up Date: 10/27/14 Follow-up type: In-patient    Stana Bunting M 10/26/2014, 9:32 PM

## 2015-04-07 ENCOUNTER — Encounter (HOSPITAL_COMMUNITY): Payer: Self-pay | Admitting: Emergency Medicine

## 2015-04-07 ENCOUNTER — Emergency Department (HOSPITAL_COMMUNITY)
Admission: EM | Admit: 2015-04-07 | Discharge: 2015-04-07 | Disposition: A | Discharge status: Home / Self Care | Payer: BLUE CROSS/BLUE SHIELD | Attending: Emergency Medicine | Admitting: Emergency Medicine

## 2015-04-07 DIAGNOSIS — S0990XA Unspecified injury of head, initial encounter: Secondary | ICD-10-CM | POA: Diagnosis not present

## 2015-04-07 DIAGNOSIS — S0993XA Unspecified injury of face, initial encounter: Secondary | ICD-10-CM | POA: Insufficient documentation

## 2015-04-07 DIAGNOSIS — Y999 Unspecified external cause status: Secondary | ICD-10-CM | POA: Diagnosis not present

## 2015-04-07 DIAGNOSIS — R6884 Jaw pain: Secondary | ICD-10-CM

## 2015-04-07 DIAGNOSIS — S299XXA Unspecified injury of thorax, initial encounter: Secondary | ICD-10-CM | POA: Insufficient documentation

## 2015-04-07 DIAGNOSIS — Z79899 Other long term (current) drug therapy: Secondary | ICD-10-CM | POA: Diagnosis not present

## 2015-04-07 DIAGNOSIS — Y92410 Unspecified street and highway as the place of occurrence of the external cause: Secondary | ICD-10-CM | POA: Insufficient documentation

## 2015-04-07 DIAGNOSIS — M549 Dorsalgia, unspecified: Secondary | ICD-10-CM

## 2015-04-07 DIAGNOSIS — S3992XA Unspecified injury of lower back, initial encounter: Secondary | ICD-10-CM | POA: Diagnosis not present

## 2015-04-07 DIAGNOSIS — Y9389 Activity, other specified: Secondary | ICD-10-CM | POA: Insufficient documentation

## 2015-04-07 DIAGNOSIS — R51 Headache: Secondary | ICD-10-CM

## 2015-04-07 DIAGNOSIS — R519 Headache, unspecified: Secondary | ICD-10-CM

## 2015-04-07 MED ORDER — IBUPROFEN 800 MG PO TABS
800.0000 mg | ORAL_TABLET | Freq: Once | ORAL | Status: AC
  Administered 2015-04-07: 800 mg via ORAL
  Filled 2015-04-07: qty 1

## 2015-04-07 MED ORDER — CYCLOBENZAPRINE HCL 10 MG PO TABS: 10.0000 mg | ORAL_TABLET | Freq: Three times a day (TID) | ORAL | Status: DC | PRN

## 2015-04-07 MED ORDER — IBUPROFEN 800 MG PO TABS: 800.0000 mg | ORAL_TABLET | Freq: Three times a day (TID) | ORAL | Status: DC | PRN
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
## 2017-06-01 ENCOUNTER — Encounter (HOSPITAL_BASED_OUTPATIENT_CLINIC_OR_DEPARTMENT_OTHER): Payer: Self-pay | Admitting: *Deleted

## 2017-06-01 ENCOUNTER — Emergency Department (HOSPITAL_BASED_OUTPATIENT_CLINIC_OR_DEPARTMENT_OTHER): Payer: Managed Care, Other (non HMO)

## 2017-06-01 ENCOUNTER — Emergency Department (HOSPITAL_BASED_OUTPATIENT_CLINIC_OR_DEPARTMENT_OTHER)
Admission: EM | Admit: 2017-06-01 | Discharge: 2017-06-01 | Disposition: A | Discharge status: Home / Self Care | Payer: Managed Care, Other (non HMO) | Attending: Emergency Medicine | Admitting: Emergency Medicine

## 2017-06-01 DIAGNOSIS — Z79899 Other long term (current) drug therapy: Secondary | ICD-10-CM | POA: Insufficient documentation

## 2017-06-01 DIAGNOSIS — M25562 Pain in left knee: Secondary | ICD-10-CM | POA: Diagnosis not present

## 2017-06-01 NOTE — ED Provider Notes (Signed)
Annette Green DEPT [facility] Provider Note   CSN: [id number] Arrival date & time: 06/01/17  1253     History   Chief Complaint Chief Complaint  Patient presents with  . Knee Injury    HPI Annette Green is a 31 y.o. female who presents today with chief complaint acute onset, waxing and waning left knee pain. She states that yesterday at 2 AM she "turned wrong and popped my kneecap out and then it popped back into place ". She states at that time she fell due to pain but did not hit her head. Pain lasted for 10-15 minutes before resolving somewhat. Denies numbness, tingling, or weakness. She states that she expenses a sharp throbbing pain with weightbearing and certain movements, but otherwise is pain-free. Has not tried anything for her symptoms. No aggravating or alleviating factors noted. Endorses swelling to the knee medially.   The history is provided by the patient.    Past Medical History:  Diagnosis Date  . SVD (spontaneous vaginal delivery) 10/23/2014    Patient Active Problem List   Diagnosis Date Noted  . Vaginal bleeding in pregnancy 10/23/2014  . SVD (spontaneous vaginal delivery) 10/23/2014  . Non-stress test with decelerations   . [redacted] weeks gestation of pregnancy     History reviewed. No pertinent surgical history.  OB History    Gravida Para Term Preterm AB Living   1 1 1     1    SAB TAB Ectopic Multiple Live Births         0 1       Home Medications    Prior to Admission medications   Medication Sig Start Date End Date Taking? Authorizing Provider  Dimethicone-Zinc Oxide-Vit A-D (A & D ZINC OXIDE EX) Apply 1 application topically daily as needed (dry skin).     [provider]  ferrous sulfate 325 (65 FE) MG tablet Take 325 mg by mouth 2 (two) times daily with a meal.    [provider]  ibuprofen (ADVIL,MOTRIN) 800 MG tablet Take 1 tablet (800 mg total) by mouth every 8 (eight) hours as needed for mild pain or moderate pain. 04/07/15    Clayton Bibles, PA-C    Family History Family History  Problem Relation Age of Onset  . Hypertension Mother     Social History Social History  Substance Use Topics  . Smoking status: Never Smoker  . Smokeless tobacco: Never Used  . Alcohol use No     Allergies   Patient has no known allergies.   Review of Systems Review of Systems  Musculoskeletal: Positive for arthralgias (L knee) and gait problem.  Neurological: Negative for weakness and numbness.     Physical Exam Updated Vital Signs BP 100/73   Pulse 94   Temp 98.8 F (37.1 C)   Resp 16   Ht 5\' 4"  (1.626 m)   Wt 72.6 kg (160 lb)   LMP 05/18/2017   SpO2 100%   BMI 27.46 kg/m   Physical Exam  Constitutional: She appears well-developed and well-nourished. No distress.  HENT:  Head: Normocephalic and atraumatic.  Eyes: Conjunctivae are normal. Right eye exhibits no discharge. Left eye exhibits no discharge.  Neck: No JVD present. No tracheal deviation present.  Cardiovascular: Normal rate and intact distal pulses.   2+ DP/PT pulses bl, negative Homan's bl   Pulmonary/Chest: Effort normal.  Abdominal: She exhibits no distension.  Musculoskeletal: She exhibits edema and tenderness.       Right knee: Normal.  Left knee: She exhibits decreased range of motion and effusion. She exhibits no ecchymosis, no deformity, no laceration, no erythema, normal alignment, no LCL laxity, normal patellar mobility, no bony tenderness, normal meniscus and no MCL laxity. Tenderness found. Medial joint line and patellar tendon tenderness noted. No lateral joint line, no MCL and no LCL tenderness noted.  Mildly decreased range of motion of the left knee on flexion due to pain. No ligamentous laxity, no varus or valgus deformity, negative anterior/posterior drawer tests of the left knee. 5/5 strength of BLE major muscle groups. Able to flex the left quadriceps without difficulty  Neurological: She is alert.  Fluent speech, no  facial droop, sensation intact to soft touch of the bilateral lower extremities. Antalgic gait, but patient able to heel walk and toe walk without difficulty  Skin: Skin is warm and dry. Capillary refill takes less than 2 seconds. No erythema.  Psychiatric: She has a normal mood and affect. Her behavior is normal.  Nursing note and vitals reviewed.    ED Treatments / Results  Labs (all labs ordered are listed, but only abnormal results are displayed) Labs Reviewed - No data to display  EKG  EKG Interpretation None       Radiology Dg Knee Complete 4 Views Left  Result Date: 06/01/2017 CLINICAL DATA:  Left knee injury. Pivoted on left leg last night. Initial encounter. EXAM: LEFT KNEE - COMPLETE 4+ VIEW COMPARISON:  None. FINDINGS: No fracture or dislocation is identified. A small knee joint effusion is present. Joint space widths are preserved. Bone mineralization is normal. IMPRESSION: Knee joint effusion without evidence of acute osseous abnormality. Electronically Signed   By: Logan Bores M.D.   On: 06/01/2017 13:21    Procedures Procedures (including critical care time)  Medications Ordered in ED Medications - No data to display   Initial Impression / Assessment and Plan / ED Course  I have reviewed the triage vital signs and the nursing notes.  Pertinent labs & imaging results that were available during my care of the patient were reviewed by me and considered in my medical decision making (see chart for details).     Pt with mild swelling to the joint spaces, knee swelling, tightness in the knee, and mildly restricted range of motion. Pt unable to perform full flexion of the knee.  Pt is without systemic symptoms, erythema or redness of the joint consistent with gout or septic joint.  Patient X-Ray negative for obvious fracture or dislocation. Pt advised to follow up with PCP or sports medicine if symptoms persist for further evaluation and treatment. Patient given brace  and crutches while in ED, conservative therapy recommended and discussed. Pt verbalized understanding of and agreement with plan and is safe for discharge home at this time.   Final Clinical Impressions(s) / ED Diagnoses   Final diagnoses:  Acute pain of left knee    New Prescriptions New Prescriptions   No medications on file     Debroah Baller 06/01/17 1417    Tegeler, Gwenyth Allegra, MD 06/01/17 [phone]

## 2017-06-01 NOTE — Discharge Instructions (Signed)
Alternate 600 mg of ibuprofen and [phone] mg of Tylenol every 3 hours as needed for pain. Do not exceed 4000 mg of Tylenol daily. Apply ice or heat to the affected area for comfort, which ever feels best. Do some gentle stretching during hot showers and baths. Follow-up with your primary care physician or a sports medicine doctor such as Dr. Barbaraann Barthel in 1-2 weeks if your symptoms persist. Return to the ED immediately if any concerning signs or symptoms develop such as fevers, worsening swelling, redness, numbness, severe pain, or loss of pulses.

## 2017-06-01 NOTE — ED Triage Notes (Signed)
Pt c/o left knee injury twisted knee last pm

## 2018-05-02 ENCOUNTER — Other Ambulatory Visit: Payer: Self-pay

## 2018-05-02 ENCOUNTER — Encounter (HOSPITAL_BASED_OUTPATIENT_CLINIC_OR_DEPARTMENT_OTHER): Payer: Self-pay | Admitting: Emergency Medicine

## 2018-05-02 ENCOUNTER — Emergency Department (HOSPITAL_BASED_OUTPATIENT_CLINIC_OR_DEPARTMENT_OTHER)
Admission: EM | Admit: 2018-05-02 | Discharge: 2018-05-02 | Disposition: A | Discharge status: Home / Self Care | Payer: Medicaid Other | Attending: Emergency Medicine | Admitting: Emergency Medicine

## 2018-05-02 DIAGNOSIS — S060X0A Concussion without loss of consciousness, initial encounter: Secondary | ICD-10-CM | POA: Insufficient documentation

## 2018-05-02 DIAGNOSIS — R6884 Jaw pain: Secondary | ICD-10-CM | POA: Diagnosis not present

## 2018-05-02 DIAGNOSIS — Y999 Unspecified external cause status: Secondary | ICD-10-CM | POA: Insufficient documentation

## 2018-05-02 DIAGNOSIS — Y939 Activity, unspecified: Secondary | ICD-10-CM | POA: Insufficient documentation

## 2018-05-02 DIAGNOSIS — Z79899 Other long term (current) drug therapy: Secondary | ICD-10-CM | POA: Insufficient documentation

## 2018-05-02 DIAGNOSIS — Y92410 Unspecified street and highway as the place of occurrence of the external cause: Secondary | ICD-10-CM | POA: Insufficient documentation

## 2018-05-02 DIAGNOSIS — S0083XA Contusion of other part of head, initial encounter: Secondary | ICD-10-CM

## 2018-05-02 DIAGNOSIS — R51 Headache: Secondary | ICD-10-CM | POA: Diagnosis present

## 2018-05-02 MED ORDER — IBUPROFEN 200 MG PO TABS
600.0000 mg | ORAL_TABLET | Freq: Once | ORAL | Status: AC
  Administered 2018-05-02: 600 mg via ORAL
  Filled 2018-05-02: qty 1

## 2018-05-02 NOTE — ED Notes (Signed)
Pt ambulatory to d/c window with steady gait. Work note given
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
## 2018-05-02 NOTE — Discharge Instructions (Addendum)
If your headache worsens or does not improve or you develop vomiting, blurry vision, dizziness, weakness or numbness in the arms or legs, confusion, or any other new/concerning symptoms and return to the ER for evaluation.  Otherwise you may take ibuprofen and/or Tylenol for discomfort or pain.
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
## 2019-07-08 ENCOUNTER — Other Ambulatory Visit: Payer: Self-pay

## 2019-07-08 ENCOUNTER — Emergency Department (HOSPITAL_BASED_OUTPATIENT_CLINIC_OR_DEPARTMENT_OTHER)
Admission: EM | Admit: 2019-07-08 | Discharge: 2019-07-08 | Disposition: A | Discharge status: Home / Self Care | Payer: Medicaid Other | Attending: Emergency Medicine | Admitting: Emergency Medicine

## 2019-07-08 ENCOUNTER — Encounter (HOSPITAL_BASED_OUTPATIENT_CLINIC_OR_DEPARTMENT_OTHER): Payer: Self-pay

## 2019-07-08 DIAGNOSIS — F419 Anxiety disorder, unspecified: Secondary | ICD-10-CM | POA: Insufficient documentation

## 2019-07-08 DIAGNOSIS — F329 Major depressive disorder, single episode, unspecified: Secondary | ICD-10-CM | POA: Diagnosis present

## 2019-07-08 MED ORDER — HYDROXYZINE HCL 25 MG PO TABS: 25.0000 mg | ORAL_TABLET | Freq: Three times a day (TID) | ORAL | 0 refills | Status: AC | PRN

## 2019-07-08 NOTE — ED Provider Notes (Signed)
[facility] EM[facility] DEPARTMENT Provider Note   CSN: [id number] Arrival date & time: 07/08/19  1737     History   Chief Complaint Chief Complaint  Patient presents with  . Psychiatric Evaluation    HPI Annette Green is a 33 y.o. female.     The history is provided by the patient.  Mental Health Problem Presenting symptoms: depression   Presenting symptoms: no agitation, no hallucinations, no self-mutilation and no suicidal thoughts   Degree of incapacity (severity):  Mild Onset quality:  Gradual Timing:  Intermittent Progression:  Waxing and waning Chronicity:  New Context: not noncompliant   Treatment compliance:  Untreated Relieved by:  Nothing Worsened by:  Nothing Associated symptoms: anhedonia, anxiety and feelings of worthlessness   Associated symptoms: no abdominal pain, no chest pain and no poor judgment   Risk factors: no hx of mental illness     Past Medical History:  Diagnosis Date  . SVD (spontaneous vaginal delivery) 10/23/2014    Patient Active Problem List   Diagnosis Date Noted  . Vaginal bleeding in pregnancy 10/23/2014  . SVD (spontaneous vaginal delivery) 10/23/2014  . Non-stress test with decelerations   . [redacted] weeks gestation of pregnancy     History reviewed. No pertinent surgical history.   OB History    Gravida  1   Para  1   Term  1   Preterm      AB      Living  1     SAB      TAB      Ectopic      Multiple  0   Live Births  1            Home Medications    Prior to Admission medications   Medication Sig Start Date End Date Taking? Authorizing Provider  Dimethicone-Zinc Oxide-Vit A-D (A & D ZINC OXIDE EX) Apply 1 application topically daily as needed (dry skin).     [provider]  hydrOXYzine (ATARAX/VISTARIL) 25 MG tablet Take 1 tablet (25 mg total) by mouth every 8 (eight) hours as needed for up to 10 doses. 07/08/19   Lennice Sites, DO    Family History Family History  Problem  Relation Age of Onset  . Hypertension Mother     Social History Social History   Tobacco Use  . Smoking status: Never Smoker  . Smokeless tobacco: Never Used  Substance Use Topics  . Alcohol use: Yes  . Drug use: Not Currently    Types: Marijuana     Allergies   Patient has no known allergies.   Review of Systems Review of Systems  Constitutional: Negative for chills and fever.  HENT: Negative for ear pain and sore throat.   Eyes: Negative for pain and visual disturbance.  Respiratory: Negative for cough and shortness of breath.   Cardiovascular: Negative for chest pain and palpitations.  Gastrointestinal: Negative for abdominal pain and vomiting.  Genitourinary: Negative for dysuria and hematuria.  Musculoskeletal: Negative for arthralgias and back pain.  Skin: Negative for color change and rash.  Neurological: Negative for seizures and syncope.  Psychiatric/Behavioral: Negative for agitation, hallucinations, self-injury, sleep disturbance and suicidal ideas. The patient is nervous/anxious.   All other systems reviewed and are negative.    Physical Exam Updated Vital Signs  ED Triage Vitals  Enc Vitals Group     BP 07/08/19 1749 119/70     Pulse Rate 07/08/19 1749 70     Resp  07/08/19 1749 16     Temp 07/08/19 1749 99.1 F (37.3 C)     Temp Source 07/08/19 1749 Oral     SpO2 07/08/19 1749 100 %     Weight 07/08/19 1748 142 lb (64.4 kg)     Height 07/08/19 1748 5\' 5"  (1.651 m)     Head Circumference --      Peak Flow --      Pain Score 07/08/19 1758 0     Pain Loc --      Pain Edu? --      Excl. in [place]? --     Physical Exam Vitals signs and nursing note reviewed.  Constitutional:      General: She is not in acute distress.    Appearance: She is well-developed.  HENT:     Head: Normocephalic and atraumatic.  Eyes:     Conjunctiva/sclera: Conjunctivae normal.  Neck:     Musculoskeletal: Neck supple.  Cardiovascular:     Rate and Rhythm: Normal rate  and regular rhythm.     Heart sounds: No murmur.  Pulmonary:     Effort: Pulmonary effort is normal. No respiratory distress.     Breath sounds: Normal breath sounds.  Abdominal:     Palpations: Abdomen is soft.     Tenderness: There is no abdominal tenderness.  Skin:    General: Skin is warm and dry.  Neurological:     Mental Status: She is alert.  Psychiatric:        Attention and Perception: Attention normal.        Mood and Affect: Mood is depressed.        Speech: Speech normal.        Behavior: Behavior normal.        Thought Content: Thought content normal. Thought content does not include homicidal or suicidal ideation. Thought content does not include homicidal or suicidal plan.        Cognition and Memory: Cognition normal.        Judgment: Judgment normal.      ED Treatments / Results  Labs (all labs ordered are listed, but only abnormal results are displayed) Labs Reviewed - No data to display  EKG None  Radiology No results found.  Procedures Procedures (including critical care time)  Medications Ordered in ED Medications - No data to display   Initial Impression / Assessment and Plan / ED Course  I have reviewed the triage vital signs and the nursing notes.  Pertinent labs & imaging results that were available during my care of the patient were reviewed by me and considered in my medical decision making (see chart for details).     Annette Green is a 33 year old female with no significant medical problems who presents to the ED with anxiety, depression.  Patient with normal vitals.  No fever.  Patient denies any suicidal or homicidal ideation.  No hallucinations.  She has had depression type symptoms over the last several weeks to months.  Has not attempted to seek outpatient help.  She does not have a plan.  I was able to contract for safety after long discussion and reassurance with the patient.  I was able to help give her resources for outpatient  follow-up.  She does state that she will come back to the emergency department if she developed any suicidal homicidal feelings.  Will prescribe Vistaril for anxiety.  Given resources for outpatient psychiatric follow-up.  She understands return precautions.  Discharged from  ED in good condition.  This chart was dictated using voice recognition software.  Despite best efforts to proofread,  errors can occur which can change the documentation meaning.    Final Clinical Impressions(s) / ED Diagnoses   Final diagnoses:  Anxiety    ED Discharge Orders         Ordered    hydrOXYzine (ATARAX/VISTARIL) 25 MG tablet  Every 8 hours PRN     07/08/19 [place], [place], DO 07/08/19 1914

## 2019-07-08 NOTE — ED Triage Notes (Signed)
Pt reports extreme saddness, crying spells, not wanting to get out of bed, decreased appetite. Pt states there is no one thing that triggered these feeling. Pt states she has family truama, financial stress, and pandemic stress. Pt states she feels like she has lost control of her situation.

## 2019-07-08 NOTE — ED Notes (Signed)
Noted that patient is sitting on the chair rocking herself back and forth.  Denies any pain.

## 2019-07-08 NOTE — ED Notes (Signed)
ED Provider at bedside. 

## 2020-02-03 ENCOUNTER — Emergency Department (HOSPITAL_COMMUNITY)
Admission: EM | Admit: 2020-02-03 | Discharge: 2020-02-04 | Disposition: A | Discharge status: Other Acute Inpatient Hospital | Payer: Medicaid Other | Attending: Emergency Medicine | Admitting: Emergency Medicine

## 2020-02-03 ENCOUNTER — Other Ambulatory Visit: Payer: Self-pay

## 2020-02-03 ENCOUNTER — Encounter (HOSPITAL_COMMUNITY): Payer: Self-pay

## 2020-02-03 ENCOUNTER — Ambulatory Visit (HOSPITAL_COMMUNITY)
Admission: AD | Admit: 2020-02-03 | Discharge: 2020-02-03 | Disposition: A | Discharge status: Home / Self Care | Payer: Medicaid Other | Attending: Psychiatry | Admitting: Psychiatry

## 2020-02-03 DIAGNOSIS — Z20822 Contact with and (suspected) exposure to covid-19: Secondary | ICD-10-CM | POA: Insufficient documentation

## 2020-02-03 DIAGNOSIS — F22 Delusional disorders: Secondary | ICD-10-CM | POA: Diagnosis not present

## 2020-02-03 DIAGNOSIS — F29 Unspecified psychosis not due to a substance or known physiological condition: Secondary | ICD-10-CM | POA: Insufficient documentation

## 2020-02-03 DIAGNOSIS — R4182 Altered mental status, unspecified: Secondary | ICD-10-CM | POA: Diagnosis present

## 2020-02-03 LAB — COMPREHENSIVE METABOLIC PANEL
ALT: 22 U/L (ref 0–44)
AST: 23 U/L (ref 15–41)
Albumin: 4.9 g/dL (ref 3.5–5.0)
Alkaline Phosphatase: 52 U/L (ref 38–126)
Anion gap: 9 (ref 5–15)
BUN: 9 mg/dL (ref 6–20)
CO2: 24 mmol/L (ref 22–32)
Calcium: 9.6 mg/dL (ref 8.9–10.3)
Chloride: 105 mmol/L (ref 98–111)
Creatinine, Ser: 0.83 mg/dL (ref 0.44–1.00)
GFR calc Af Amer: >60 mL/min (ref >60)
GFR calc non Af Amer: >60 mL/min (ref >60)
Glucose, Bld: 120 mg/dL — ABNORMAL HIGH (ref 70–99)
Potassium: 3.9 mmol/L (ref 3.5–5.1)
Sodium: 138 mmol/L (ref 135–145)
Total Bilirubin: 1.1 mg/dL (ref 0.3–1.2)
Total Protein: 8 g/dL (ref 6.5–8.1)

## 2020-02-03 LAB — CBC WITH DIFFERENTIAL/PLATELET
Abs Immature Granulocytes: 0.05 10*3/uL (ref 0.00–0.07)
Basophils Absolute: 0 10*3/uL (ref 0.0–0.1)
Basophils Relative: 0 %
Eosinophils Absolute: 0 10*3/uL (ref 0.0–0.5)
Eosinophils Relative: 0 %
HCT: 37.1 % (ref 36.0–46.0)
Hemoglobin: 11.9 g/dL — ABNORMAL LOW (ref 12.0–15.0)
Immature Granulocytes: 0 %
Lymphocytes Relative: 10 %
Lymphs Abs: 1.2 10*3/uL (ref 0.7–4.0)
MCH: 27.2 pg (ref 26.0–34.0)
MCHC: 32.1 g/dL (ref 30.0–36.0)
MCV: 84.7 fL (ref 80.0–100.0)
Monocytes Absolute: 0.4 10*3/uL (ref 0.1–1.0)
Monocytes Relative: 3 %
Neutro Abs: 10.3 10*3/uL — ABNORMAL HIGH (ref 1.7–7.7)
Neutrophils Relative %: 87 %
Platelets: 337 10*3/uL (ref 150–400)
RBC: 4.38 MIL/uL (ref 3.87–5.11)
RDW: 13.2 % (ref 11.5–15.5)
WBC: 12 10*3/uL — ABNORMAL HIGH (ref 4.0–10.5)
nRBC: 0 % (ref 0.0–0.2)

## 2020-02-03 LAB — RAPID URINE DRUG SCREEN, HOSP PERFORMED
Amphetamines: NOT DETECTED
Barbiturates: NOT DETECTED
Benzodiazepines: NOT DETECTED
Cocaine: NOT DETECTED
Opiates: NOT DETECTED
Tetrahydrocannabinol: POSITIVE — AB

## 2020-02-03 LAB — RESPIRATORY PANEL BY RT PCR (FLU A&B, COVID)
Influenza A by PCR: NEGATIVE
Influenza B by PCR: NEGATIVE
SARS Coronavirus 2 by RT PCR: NEGATIVE

## 2020-02-03 LAB — ETHANOL: Alcohol, Ethyl (B): <10 mg/dL (ref <10)

## 2020-02-03 LAB — I-STAT BETA HCG BLOOD, ED (MC, WL, AP ONLY): I-stat hCG, quantitative: <5 m[IU]/mL (ref <5)

## 2020-02-03 MED ORDER — LORAZEPAM 2 MG/ML IJ SOLN: 2.0000 mg | Freq: Once | INTRAMUSCULAR | Status: DC
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
## 2020-02-03 NOTE — Progress Notes (Addendum)
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 Pt unable to follow simple directions. Will continue to monitor pt for safety with police nearby.

## 2020-02-03 NOTE — Progress Notes (Signed)
Received [place] this PM, awake in her room with the sister at the bedside. Roderic Palau the social worker visited with patient to obtain information. She was allowed to access her phone. Later her voice tone increased with a focus about who is caring for her son. The conversation continued pass midnight. She was offered Haldol to help calm her down, she refused and agreed to go to bed. She slept throughout the morning.
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
## 2020-02-03 NOTE — BH Assessment (Signed)
[facility] Assessment Progress Note Patient was seen at [facility] as a walk in. Patient was assessed and sent to [facility] for medical clearance.

## 2020-02-03 NOTE — ED Provider Notes (Signed)
[place] DEPT Provider Note   CSN: [id number] Arrival date & time: 02/03/20  1357     History Chief Complaint  Patient presents with  . IVC    Annette Green is a 34 y.o. female.  HPI    34 year old comes in a chief complaint of involuntary commitment.  Patient has been committed by her family member.  I spoke with patient's mother, who reports that over the last 2 months patient has been acting erratically.  She has been paranoid, stating that her family is out to get her.  Patient also has been hostile towards many family members.  They are also concerned about the 41-year-old that lives with the patient, as patient has been found to be in different stages every week. Family concerned that the child is likely endangered because he is not placed in a car seat and mother's behavior has been erratic.  Patient reports that her family is jealous of her.  She had come to the ER last year because she was depressed, and was cleared by the ED physician.  She has no SI, HI.  Past Medical History:  Diagnosis Date  . SVD (spontaneous vaginal delivery) 10/23/2014    Patient Active Problem List   Diagnosis Date Noted  . Vaginal bleeding in pregnancy 10/23/2014  . SVD (spontaneous vaginal delivery) 10/23/2014  . Non-stress test with decelerations   . [redacted] weeks gestation of pregnancy     No past surgical history on file.   OB History    Gravida  1   Para  1   Term  1   Preterm      AB      Living  1     SAB      TAB      Ectopic      Multiple  0   Live Births  1           Family History  Problem Relation Age of Onset  . Hypertension Mother     Social History   Tobacco Use  . Smoking status: Never Smoker  . Smokeless tobacco: Never Used  Substance Use Topics  . Alcohol use: Yes  . Drug use: Not Currently    Types: Marijuana    Home Medications Prior to Admission medications   Medication Sig Start Date End Date  Taking? Authorizing Provider  Dimethicone-Zinc Oxide-Vit A-D (A & D ZINC OXIDE EX) Apply 1 application topically daily as needed (dry skin).     [provider]  hydrOXYzine (ATARAX/VISTARIL) 25 MG tablet Take 1 tablet (25 mg total) by mouth every 8 (eight) hours as needed for up to 10 doses. 07/08/19   Lennice Sites, DO    Allergies    Patient has no known allergies.  Review of Systems   Review of Systems  Constitutional: Negative for activity change.  Respiratory: Negative for shortness of breath.   Cardiovascular: Negative for chest pain.  Gastrointestinal: Negative for nausea and vomiting.  Psychiatric/Behavioral: Negative for self-injury and suicidal ideas.  All other systems reviewed and are negative.   Physical Exam Updated Vital Signs BP 131/85   Pulse 99   Temp 99.5 F (37.5 C) (Oral)   Resp 18   Ht 5\' 4"  (1.626 m)   Wt 81.6 kg   SpO2 100%   BMI 30.90 kg/m   Physical Exam Vitals and nursing note reviewed.  Constitutional:      Appearance: She is well-developed.  HENT:  Head: Normocephalic and atraumatic.  Cardiovascular:     Rate and Rhythm: Normal rate.  Pulmonary:     Effort: Pulmonary effort is normal.  Abdominal:     General: Bowel sounds are normal.  Musculoskeletal:     Cervical back: Normal range of motion and neck supple.  Skin:    General: Skin is warm and dry.  Neurological:     Mental Status: She is alert and oriented to person, place, and time.  Psychiatric:     Comments: Pressured speech     ED Results / Procedures / Treatments   Labs (all labs ordered are listed, but only abnormal results are displayed) Labs Reviewed - No data to display  EKG None  Radiology No results found.  Procedures Procedures (including critical care time)  Medications Ordered in ED Medications - No data to display  ED Course  I have reviewed the triage vital signs and the nursing notes.  Pertinent labs & imaging results that were  available during my care of the patient were reviewed by me and considered in my medical decision making (see chart for details).    MDM Rules/Calculators/A&P                      34 year old comes in a chief complaint of involuntary commitment.  Patient was committed by her family members.  Patient has no complaints and is unsure why she is in the emergency room.  It appears that patient might be having some paranoia.  There is family history of schizophrenia, patient might have a new diagnosis of it.  Patient is directable.  She denies any SI, HI.  Family however is concerned about patient's wellbeing and safety.  They are also concerned about patient's child's safety.  The patient's child is currently with grandmother, who I spoke with her earlier.  Patient is medically cleared for psych evaluation. We have consulted CPS to follow-up with patient's child.  Patient's mother's contact information has been provided to them.  It does not appear to me that the child is at imminent danger, but could be endangered unintentionally if patient's behavior spirals out.  Currently I think that patient is demonstrating some paranoia and possibly grandiose.  She will need assessment by [facility] team for optimal diagnosis and management.  Final Clinical Impression(s) / ED Diagnoses Final diagnoses:  Paranoid behavior ([place])    Rx / DC Orders ED Discharge Orders    None       Varney Biles, MD 02/03/20 1556

## 2020-02-03 NOTE — ED Notes (Signed)
EKg completed and given to Dr. Regenia Skeeter to review.

## 2020-02-03 NOTE — H&P (Signed)
Behavioral Health Medical Screening Exam  Annette Green is an 34 y.o. female who presents with police under an IVC. Patient disputes all information contained in the IVC stating "My schizophrenic sister did that. She is jealous of me. 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 There is nothing wrong with me. Yes I drive around with my grandmother's ashes. My family think that is strange but I don't."  Total Time spent with patient: 20 minutes  Psychiatric Specialty Exam: Physical Exam  Constitutional: She is oriented to person, place, and time. She appears well-developed and well-nourished.  Cardiovascular: Normal rate, regular rhythm, normal heart sounds and intact distal pulses.  Respiratory: Effort normal and breath sounds normal.  Neurological: She is alert and oriented to person, place, and time.  Skin: Skin is warm.    Review of Systems  Respiratory: Negative for cough, choking and chest tightness.   Cardiovascular: Negative for chest pain, palpitations and leg swelling.  Neurological: Negative for seizures, light-headedness and numbness.  Psychiatric/Behavioral: Positive for agitation, dysphoric mood and sleep disturbance. The patient is nervous/anxious.     Blood pressure 107/79, pulse (!) 109, temperature 98.3 F (36.8 C), temperature source Oral, resp. rate 16, SpO2 100 %.There is no height or weight on file to calculate BMI.  General Appearance: Casual  Eye Contact:  Good  Speech:  Pressured  Volume:  Increased  Mood:  Irritable  Affect:  Labile  Thought Process:  Goal Directed  Orientation:  Full (Time, Place, and Person)  Thought Content:  Rumination  Suicidal Thoughts:  No  Homicidal Thoughts:  No  Memory:  Immediate;   Good Recent;   Fair Remote;   Fair  Judgement:  Poor  Insight:  Lacking  Psychomotor Activity:  Normal  Concentration: Concentration: Good and Attention Span: Good  Recall:  [place]  Language: Good  Akathisia:  No   Handed:  Right  AIMS (if indicated):     Assets:  Communication Skills Desire for Improvement Housing Leisure Time Physical Health Resilience Social Support Talents/Skills  Sleep:       Musculoskeletal: Strength & Muscle Tone: within normal limits Gait & Station: normal Patient leans: N/A  Blood pressure 107/79, pulse (!) 109, temperature 98.3 F (36.8 C), temperature source Oral, resp. rate 16, SpO2 100 %.  Recommendations:  Will send to [facility] for medical clearance. She appears to have possible mania but will rule out drug use, any possible medical causes of symptoms.   Based on my evaluation the patient does not appear to have an emergency medical condition.  Elmarie Shiley, NP 02/03/2020, 1:50 PM
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
## 2020-02-03 NOTE — Progress Notes (Signed)
Patient meets inpatient criteria per Elmarie Shiley, NP. Patient has been faxed out to the following facilities for review:   [facility]  [facility]  [facility]  TTS will continue to follow and secure bed placement.   Domenic Schwab, MSW, LCSW-A Clinical Disposition Social Worker [facility]/TTS [phone]

## 2020-02-03 NOTE — BH Assessment (Signed)
[place] Assessment Progress Note Case was staffed with Rosana Hoes NP who recommended patient be sent to [facility] for medical clearance. Patient meets inpatient criteria.

## 2020-02-03 NOTE — Progress Notes (Signed)
CSW received a call from [facility] with DSS/CPS asking for the following info from the pt:  Pt' mother is Audley Hose 01/13/68 at ph: [phone]  Pt's sister is An[place]illa Sweat  01/13/68  at ph:   Pt's niece is, Caryl Comes Sweat's daughter) Rollen Sox Sweat  02/08/2009.  Pt's son is Eliseo Gum 10/23/14 is pt's son (33 years old).  Per pt Lucita Ferrara, pt's sister [place] lost custody of her daughter Rollen Sox Sweat on Dec 5th and is not allowed to see Sinaiya.  Since then, per the pt Zollie Scale, pt's sister has convinced pt's mother Audley Hose to let pt's sister back into the home although this is not allowed.  Pt states that pt's friend Leotis Pain at the following address states she can house pt's son and is willing:  Ph: [phone] [place], [place] [address] or [address]  The above information was provided to D. Cheeley with DSS/CPS who voiced udnerstanding and aded it to pt's open CPS case file.  Please reconsult if future social work needs arise.  CSW signing off, as social work intervention is no longer needed.  Alphonse Guild. Kendal Raffo  MSW, LCSW, LCAS, CSI Transitions of Care Clinical Social Worker Care Coordination Department Ph: [phone]

## 2020-02-03 NOTE — Progress Notes (Signed)
Pt accepted to [facility]; Adult Unit. Room to be determined.     Dr. Orma Render is the accepting and attending physician.   Call report to [phone].   Mechele Claude @ [facility] ED notified.     Pt is IVC.    Pt may be transported by  [facility].   Pt scheduled to arrive on 02/04/20 anytime after 7:00am.   Domenic Schwab, MSW, [place] Disposition Social Worker [facility]/TTS [phone]
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
## 2020-02-03 NOTE — ED Notes (Signed)
Dinner tray ordered for pt

## 2020-02-03 NOTE — ED Notes (Signed)
Shared pt's concerns about the well being of her child spending the night at her mother's house with Roderic Palau SW. Pt said that her mother is not capable of caring for him and there her sister, who she said started all this, is Schizophrenic. Pt wanted CPS to check on her son.  Pt has been calm, cooperative, and appropriate while here until realizing that she cannot leave tonight. For the above reasons, she is now very upset. This writer informed Shuvon Rankin NP as well.

## 2020-02-03 NOTE — ED Notes (Signed)
Pt seem to be upset that her friend has not pick up her son yet from her mother  To make sure her son is safe.

## 2020-02-04 MED ORDER — HALOPERIDOL 5 MG PO TABS
5.0000 mg | ORAL_TABLET | Freq: Once | ORAL | Status: DC
  Filled 2020-02-04: qty 1

## 2020-02-04 NOTE — Progress Notes (Signed)
CSW received a call from Leonia Corona at DSS/CPS who states pt's child was observed and seen to be safe with the pt's mother.  02/04/20 4:54pm.  Please reconsult if future social work needs arise.  CSW signing off, as social work intervention is no longer needed.  Alphonse Guild. Athol Bolds  MSW, LCSW, LCAS, CSI Transitions of Care Clinical Social Worker Care Coordination Department Ph: [phone]

## 2020-02-04 NOTE — Discharge Instructions (Signed)
Go to [facility].  Dr. Orma Render accepting

## 2020-02-04 NOTE — ED Provider Notes (Signed)
Emergency Medicine Observation Re-evaluation Note  Annette Green is a 34 y.o. female, seen on rounds today.  Pt initially presented to the ED for complaints of IVC Currently, the patient is psyc tx  Physical Exam  BP 126/76 (BP Location: Left Arm)   Pulse 69   Temp 100 F (37.8 C) (Oral)   Resp 18   Ht 5\' 4"  (1.626 m)   Wt 81.6 kg   SpO2 100%   BMI 30.90 kg/m  Physical Exam alert in nad  ED Course / MDM  EKG:EKG Interpretation  Date/Time:  Monday Feb 03 2020 17:14:28 EDT Ventricular Rate:  69 PR Interval:  134 QRS Duration: 94 QT Interval:  434 QTC Calculation: 465 R Axis:   83 Text Interpretation: Normal sinus rhythm with sinus arrhythmia Normal ECG No old tracing to compare Confirmed by Delora Fuel ([id number]) on 02/04/2020 4:35:10 AM    I have reviewed the labs performed to date as well as medications administered while in observation.  Recent changes in the last 24 hours include none Plan  Current plan is for placement .   Milton Ferguson, MD 02/04/20 [phone]

## 2020-02-04 NOTE — ED Notes (Signed)
Pt DC d off unit to facility. Pt alert, calm, cooperative, no s/s of distress. DC information given to sheriff for facility. Belongings given to sheriff for facility. Pt ambulatory off unit, escorted and transported by sheriff.

## 2023-06-22 ENCOUNTER — Ambulatory Visit: Payer: MEDICAID | Admitting: Obstetrics and Gynecology
